# Patient Record
Sex: Male | Born: 1990 | Race: Black or African American | Hispanic: No | Smoking: Never smoker
Health system: Southern US, Community
[De-identification: ages and names within clinical notes are randomized; demographics above are authoritative.]

---
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
## 2013-09-24 ENCOUNTER — Emergency Department: Payer: Self-pay | Admitting: Internal Medicine

## 2013-09-24 LAB — URINALYSIS, COMPLETE
BLOOD: NEGATIVE
Bacteria: NONE SEEN
Bilirubin,UR: NEGATIVE
Glucose,UR: NEGATIVE mg/dL (ref 0–75)
Ketone: NEGATIVE
LEUKOCYTE ESTERASE: NEGATIVE
Nitrite: NEGATIVE
Ph: 5 (ref 4.5–8.0)
Protein: NEGATIVE
RBC,UR: NONE SEEN /HPF (ref 0–5)
Specific Gravity: 1.024 (ref 1.003–1.030)
Squamous Epithelial: <1

## 2013-09-24 LAB — COMPREHENSIVE METABOLIC PANEL
ALK PHOS: 98 U/L
ALT: 31 U/L (ref 12–78)
Albumin: 3.8 g/dL (ref 3.4–5.0)
Anion Gap: 9 (ref 7–16)
BUN: 11 mg/dL (ref 7–18)
Bilirubin,Total: 1 mg/dL (ref 0.2–1.0)
CO2: 24 mmol/L (ref 21–32)
Calcium, Total: 9.1 mg/dL (ref 8.5–10.1)
Chloride: 107 mmol/L (ref 98–107)
Creatinine: 1.04 mg/dL (ref 0.60–1.30)
EGFR (Non-African Amer.): >60
Glucose: 86 mg/dL (ref 65–99)
Osmolality: 278 (ref 275–301)
Potassium: 4 mmol/L (ref 3.5–5.1)
SGOT(AST): 34 U/L (ref 15–37)
Sodium: 140 mmol/L (ref 136–145)
TOTAL PROTEIN: 7.5 g/dL (ref 6.4–8.2)

## 2013-09-24 LAB — CBC
HCT: 43 % (ref 40.0–52.0)
HGB: 14.3 g/dL (ref 13.0–18.0)
MCH: 28.5 pg (ref 26.0–34.0)
MCHC: 33.2 g/dL (ref 32.0–36.0)
MCV: 86 fL (ref 80–100)
Platelet: 197 10*3/uL (ref 150–440)
RBC: 5.02 10*6/uL (ref 4.40–5.90)
RDW: 13.4 % (ref 11.5–14.5)
WBC: 4.4 10*3/uL (ref 3.8–10.6)

## 2013-09-24 LAB — LIPASE, BLOOD: LIPASE: 221 U/L (ref 73–393)

## 2014-08-03 ENCOUNTER — Emergency Department: Payer: Self-pay | Admitting: Emergency Medicine

## 2014-08-15 ENCOUNTER — Emergency Department: Payer: Self-pay | Admitting: Emergency Medicine

## 2015-05-14 ENCOUNTER — Emergency Department
Admission: EM | Admit: 2015-05-14 | Discharge: 2015-05-14 | Disposition: A | Discharge status: Home / Self Care | Payer: Self-pay | Attending: Emergency Medicine | Admitting: Emergency Medicine

## 2015-05-14 ENCOUNTER — Encounter: Payer: Self-pay | Admitting: Emergency Medicine

## 2015-05-14 ENCOUNTER — Emergency Department: Payer: Self-pay

## 2015-05-14 DIAGNOSIS — Y9389 Activity, other specified: Secondary | ICD-10-CM | POA: Insufficient documentation

## 2015-05-14 DIAGNOSIS — Y9289 Other specified places as the place of occurrence of the external cause: Secondary | ICD-10-CM | POA: Insufficient documentation

## 2015-05-14 DIAGNOSIS — S46911A Strain of unspecified muscle, fascia and tendon at shoulder and upper arm level, right arm, initial encounter: Secondary | ICD-10-CM | POA: Insufficient documentation

## 2015-05-14 DIAGNOSIS — Y998 Other external cause status: Secondary | ICD-10-CM | POA: Insufficient documentation

## 2015-05-14 DIAGNOSIS — X58XXXA Exposure to other specified factors, initial encounter: Secondary | ICD-10-CM | POA: Insufficient documentation

## 2015-05-14 MED ORDER — MELOXICAM 15 MG PO TABS: 15.0000 mg | ORAL_TABLET | Freq: Every day | ORAL | Status: DC

## 2015-05-14 NOTE — Discharge Instructions (Signed)

## 2015-05-14 NOTE — ED Notes (Signed)
Assessed per PA 

## 2015-05-14 NOTE — ED Notes (Signed)
Patient ambulatory to triage with steady gait, without difficulty or distress noted; pt reports right shoulder pain since last week with no known injury

## 2015-05-14 NOTE — ED Provider Notes (Signed)
[facility] Emergency Department Provider Note ____________________________________________  Time seen: Approximately 10:39 PM  I have reviewed the triage vital signs and the nursing notes.   HISTORY  Chief Complaint Shoulder Pain  HPI Brent Page is a 24 y.o. male who presents to the emergency department for evaluation of right shoulder pain. States that he has had pain for the last week. He denies injury. Pain is reproduced with certain movements. He has not been taking anything for pain.   History reviewed. No pertinent past medical history.  There are no active problems to display for this patient.   History reviewed. No pertinent past surgical history.  Current Outpatient Rx  Name  Route  Sig  Dispense  Refill  . meloxicam (MOBIC) 15 MG tablet   Oral   Take 1 tablet (15 mg total) by mouth daily.   30 tablet   0     Allergies Review of patient's allergies indicates no known allergies.  No family history on file.  Social History Social History  Substance Use Topics  . Smoking status: Never Smoker   . Smokeless tobacco: None  . Alcohol Use: No    Review of Systems Constitutional: No recent illness. Eyes: No visual changes. ENT: No sore throat. Cardiovascular: Denies chest pain or palpitations. Respiratory: Denies shortness of breath. Gastrointestinal: No abdominal pain.  Genitourinary: Negative for dysuria. Musculoskeletal: Pain in right shoulder Skin: Negative for rash. Neurological: Negative for headaches, focal weakness or numbness. 10-point ROS otherwise negative.  ____________________________________________   PHYSICAL EXAM:  VITAL SIGNS: ED Triage Vitals  Enc Vitals Group     BP 05/14/15 2105 132/51 mmHg     Pulse Rate 05/14/15 2105 84     Resp 05/14/15 2105 18     Temp 05/14/15 2105 97.6 F (36.4 C)     Temp src --      SpO2 05/14/15 2105 100 %     Weight 05/14/15 2105 160 lb (72.576 kg)     Height 05/14/15  2105 5\' 7"  (1.702 m)     Head Cir --      Peak Flow --      Pain Score 05/14/15 2104 9     Pain Loc --      Pain Edu? --      Excl. in GC? --     Constitutional: Alert and oriented. Well appearing and in no acute distress. Eyes: Conjunctivae are normal. EOMI. Head: Atraumatic. Nose: No congestion/rhinnorhea. Neck: No stridor.  Respiratory: Normal respiratory effort.   Musculoskeletal: Anterior right shoulder tenderness elicited with movement against resistance. Neurologic:  Normal speech and language. No gross focal neurologic deficits are appreciated. Speech is normal. No gait instability. Skin:  Skin is warm, dry and intact. Atraumatic. Psychiatric: Mood and affect are normal. Speech and behavior are normal.  ____________________________________________   LABS (all labs ordered are listed, but only abnormal results are displayed)  Labs Reviewed - No data to display ____________________________________________  RADIOLOGY  Right shoulder negative for acute bony abnormality. ____________________________________________   PROCEDURES  Procedure(s) performed: None   ____________________________________________   INITIAL IMPRESSION / ASSESSMENT AND PLAN / ED COURSE  Pertinent labs & imaging results that were available during my care of the patient were reviewed by me and considered in my medical decision making (see chart for details).  Patient was advised to follow-up with the orthopedic doctor for symptoms that are not improving over the next week. He was advised to return to the emergency department  for symptoms that change or worsen if he is unable schedule an appointment. ____________________________________________   FINAL CLINICAL IMPRESSION(S) / ED DIAGNOSES  Final diagnoses:  Shoulder strain, right, initial encounter       Chinita Pester, FNP 05/14/15 2241  Darien Ramus, MD 05/14/15 2320

## 2018-07-20 ENCOUNTER — Ambulatory Visit: Payer: Self-pay | Admitting: Urology

## 2018-07-26 ENCOUNTER — Ambulatory Visit: Payer: Self-pay | Admitting: Urology

## 2018-07-26 ENCOUNTER — Encounter: Payer: Self-pay | Admitting: Urology

## 2021-03-22 ENCOUNTER — Other Ambulatory Visit: Payer: Self-pay

## 2021-03-22 ENCOUNTER — Encounter: Payer: Self-pay | Admitting: Emergency Medicine

## 2021-03-22 ENCOUNTER — Emergency Department: Payer: Commercial Managed Care - PPO

## 2021-03-22 DIAGNOSIS — N390 Urinary tract infection, site not specified: Secondary | ICD-10-CM | POA: Diagnosis not present

## 2021-03-22 DIAGNOSIS — R1011 Right upper quadrant pain: Secondary | ICD-10-CM

## 2021-03-22 DIAGNOSIS — K529 Noninfective gastroenteritis and colitis, unspecified: Secondary | ICD-10-CM | POA: Diagnosis not present

## 2021-03-22 LAB — COMPREHENSIVE METABOLIC PANEL
ALT: 31 U/L (ref 0–44)
AST: 27 U/L (ref 15–41)
Albumin: 4.4 g/dL (ref 3.5–5.0)
Alkaline Phosphatase: 52 U/L (ref 38–126)
Anion gap: 11 (ref 5–15)
BUN: 12 mg/dL (ref 6–20)
CO2: 24 mmol/L (ref 22–32)
Calcium: 9.4 mg/dL (ref 8.9–10.3)
Chloride: 101 mmol/L (ref 98–111)
Creatinine, Ser: 1.23 mg/dL (ref 0.61–1.24)
GFR, Estimated: >60 mL/min (ref >60)
Glucose, Bld: 91 mg/dL (ref 70–99)
Potassium: 3.7 mmol/L (ref 3.5–5.1)
Sodium: 136 mmol/L (ref 135–145)
Total Bilirubin: 1.2 mg/dL (ref 0.3–1.2)
Total Protein: 8.1 g/dL (ref 6.5–8.1)

## 2021-03-22 LAB — CBC
HCT: 47.3 % (ref 39.0–52.0)
Hemoglobin: 16.4 g/dL (ref 13.0–17.0)
MCH: 29.1 pg (ref 26.0–34.0)
MCHC: 34.7 g/dL (ref 30.0–36.0)
MCV: 83.9 fL (ref 80.0–100.0)
Platelets: 263 10*3/uL (ref 150–400)
RBC: 5.64 MIL/uL (ref 4.22–5.81)
RDW: 11.9 % (ref 11.5–15.5)
WBC: 4.5 10*3/uL (ref 4.0–10.5)
nRBC: 0 % (ref 0.0–0.2)

## 2021-03-22 LAB — LIPASE, BLOOD: Lipase: 51 U/L (ref 11–51)

## 2021-03-22 MED ORDER — ACETAMINOPHEN 325 MG PO TABS
650.0000 mg | ORAL_TABLET | Freq: Once | ORAL | Status: AC
  Administered 2021-03-22: 650 mg via ORAL

## 2021-03-22 MED ORDER — ACETAMINOPHEN 325 MG PO TABS
ORAL_TABLET | ORAL | Status: AC
  Filled 2021-03-22: qty 2

## 2021-03-22 NOTE — ED Triage Notes (Signed)
C/O RUQ abdominal pain x 3 days.  Referred to ED by [facility]  AAOx3.  Skin warm and dry.NAD

## 2021-03-22 NOTE — ED Notes (Signed)
Pt reports he has pain administered some Tylenol

## 2021-03-23 ENCOUNTER — Emergency Department: Payer: Commercial Managed Care - PPO

## 2021-03-23 ENCOUNTER — Emergency Department
Admission: EM | Admit: 2021-03-23 | Discharge: 2021-03-23 | Disposition: A | Discharge status: Home / Self Care | Payer: Commercial Managed Care - PPO | Attending: Emergency Medicine | Admitting: Emergency Medicine

## 2021-03-23 ENCOUNTER — Other Ambulatory Visit: Payer: Self-pay

## 2021-03-23 DIAGNOSIS — R0602 Shortness of breath: Secondary | ICD-10-CM | POA: Diagnosis not present

## 2021-03-23 DIAGNOSIS — K5900 Constipation, unspecified: Secondary | ICD-10-CM

## 2021-03-23 DIAGNOSIS — R109 Unspecified abdominal pain: Secondary | ICD-10-CM

## 2021-03-23 DIAGNOSIS — R1011 Right upper quadrant pain: Secondary | ICD-10-CM

## 2021-03-23 DIAGNOSIS — N390 Urinary tract infection, site not specified: Secondary | ICD-10-CM

## 2021-03-23 LAB — URINALYSIS, ROUTINE W REFLEX MICROSCOPIC
Bacteria, UA: NONE SEEN
Bilirubin Urine: NEGATIVE
Glucose, UA: NEGATIVE mg/dL
Hgb urine dipstick: NEGATIVE
Ketones, ur: 20 mg/dL — AB
Nitrite: NEGATIVE
Protein, ur: NEGATIVE mg/dL
Specific Gravity, Urine: 1.029 (ref 1.005–1.030)
pH: 5 (ref 5.0–8.0)

## 2021-03-23 LAB — TROPONIN I (HIGH SENSITIVITY): Troponin I (High Sensitivity): 4 ng/L (ref <18)

## 2021-03-23 LAB — D-DIMER, QUANTITATIVE: D-Dimer, Quant: 0.27 ug/mL-FEU (ref 0.00–0.50)

## 2021-03-23 MED ORDER — FOSFOMYCIN TROMETHAMINE 3 G PO PACK
3.0000 g | PACK | Freq: Once | ORAL | Status: AC
  Administered 2021-03-23: 3 g via ORAL
  Filled 2021-03-23: qty 3

## 2021-03-23 MED ORDER — SODIUM CHLORIDE 0.9 % IV BOLUS
1000.0000 mL | Freq: Once | INTRAVENOUS | Status: AC
  Administered 2021-03-23: 1000 mL via INTRAVENOUS

## 2021-03-23 MED ORDER — LACTULOSE 10 GM/15ML PO SOLN: 20.0000 g | Freq: Every day | ORAL | 0 refills | Status: DC | PRN

## 2021-03-23 MED ORDER — KETOROLAC TROMETHAMINE 30 MG/ML IJ SOLN
15.0000 mg | Freq: Once | INTRAMUSCULAR | Status: AC
  Administered 2021-03-23: 15 mg via INTRAVENOUS
  Filled 2021-03-23: qty 1

## 2021-03-23 MED ORDER — MORPHINE SULFATE (PF) 4 MG/ML IV SOLN
4.0000 mg | Freq: Once | INTRAVENOUS | Status: AC
  Administered 2021-03-23: 4 mg via INTRAVENOUS
  Filled 2021-03-23: qty 1

## 2021-03-23 MED ORDER — DICYCLOMINE HCL 20 MG PO TABS
20.0000 mg | ORAL_TABLET | Freq: Four times a day (QID) | ORAL | 0 refills | Status: DC
Start: 2021-03-23 — End: 2024-01-21

## 2021-03-23 NOTE — ED Provider Notes (Addendum)
[facility] Emergency Department Provider Note   ____________________________________________   Event Date/Time   First MD Initiated Contact with Patient 03/23/21 [phone]     (approximate)  I have reviewed the triage vital signs and the nursing notes.   HISTORY  Chief Complaint Abdominal pain    HPI Brent Page is a 30 y.o. male referred to the ED from [facility] for further evaluation of abdominal/flank pain.  Patient reports a 3 to 4-day history of waxing/waning pain in his right flank/right upper quadrant area.  Denies associated fever, cough, chest pain, nausea, vomiting, hematuria.  States when the pain is severe it makes him feel short of breath.  Denies anorexia.  Denies recent trauma, travel or hormone use.     Past medical history None  There are no problems to display for this patient.   History reviewed. No pertinent surgical history.  Prior to Admission medications   Medication Sig Start Date End Date Taking? Authorizing Provider  dicyclomine (BENTYL) 20 MG tablet Take 1 tablet (20 mg total) by mouth every 6 (six) hours. 03/23/21  Yes Irean Hong, MD  lactulose (CHRONULAC) 10 GM/15ML solution Take 30 mLs (20 g total) by mouth daily as needed for mild constipation. 03/23/21  Yes Irean Hong, MD    Allergies Patient has no known allergies.  No family history on file.  Social History Social History   Tobacco Use   Smoking status: Never  Substance Use Topics   Alcohol use: No    Review of Systems  Constitutional: No fever/chills Eyes: No visual changes. ENT: No sore throat. Cardiovascular: Denies chest pain. Respiratory: Denies shortness of breath. Gastrointestinal: Positive for right flank and abdominal pain.  No nausea, no vomiting.  No diarrhea.  No constipation. Genitourinary: Negative for dysuria. Musculoskeletal: Negative for back pain. Skin: Negative for rash. Neurological: Negative for headaches, focal weakness or  numbness.   ____________________________________________   PHYSICAL EXAM:  VITAL SIGNS: ED Triage Vitals  Enc Vitals Group     BP 03/23/21 0304 (!) 143/86     Pulse Rate 03/23/21 0304 86     Resp 03/23/21 0304 16     Temp --      Temp src --      SpO2 03/23/21 0304 100 %     Weight 03/22/21 1615 160 lb 0.9 oz (72.6 kg)     Height 03/22/21 1615 5\' 7"  (1.702 m)     Head Circumference --      Peak Flow --      Pain Score 03/22/21 1615 10     Pain Loc --      Pain Edu? --      Excl. in GC? --     Constitutional: Alert and oriented. Well appearing and in no acute distress. Eyes: Conjunctivae are normal. PERRL. EOMI. Head: Atraumatic. Nose: No congestion/rhinnorhea. Mouth/Throat: Mucous membranes are moist.   Neck: No stridor.   Cardiovascular: Normal rate, regular rhythm. Grossly normal heart sounds.  Good peripheral circulation. Respiratory: Normal respiratory effort.  No retractions. Lungs CTAB. Gastrointestinal: Soft and nontender to light or deep palpation. No distention. No abdominal bruits. No CVA tenderness. Musculoskeletal: No lower extremity tenderness nor edema.  No joint effusions. Neurologic:  Normal speech and language. No gross focal neurologic deficits are appreciated. No gait instability. Skin:  Skin is warm, dry and intact. No rash noted.  No vesicles. Psychiatric: Mood and affect are normal. Speech and behavior are normal.  ____________________________________________  LABS (all labs ordered are listed, but only abnormal results are displayed)  Labs Reviewed  URINALYSIS, ROUTINE W REFLEX MICROSCOPIC - Abnormal; Notable for the following components:      Result Value   Color, Urine YELLOW (*)    APPearance HAZY (*)    Ketones, ur 20 (*)    Leukocytes,Ua TRACE (*)    All other components within normal limits  LIPASE, BLOOD  COMPREHENSIVE METABOLIC PANEL  CBC  D-DIMER, QUANTITATIVE  TROPONIN I (HIGH SENSITIVITY)    ____________________________________________  EKG  ED ECG REPORT I, Jhonnie Aliano J, the attending physician, personally viewed and interpreted this ECG.   Date: 03/23/2021  EKG Time: 0546  Rate: 86  Rhythm: normal sinus rhythm  Axis: Normal  Intervals:none  ST&T Change: Nonspecific  ____________________________________________  RADIOLOGY I, Jonika Critz J, personally viewed and evaluated these images (plain radiographs) as part of my medical decision making, as well as reviewing the written report by the radiologist.  ED MD interpretation: Ultrasound demonstrates mild hepatic steatosis; chest x-ray demonstrates no acute cardiopulmonary process; CT renal stone study unremarkable, no appendicitis  Official radiology report(s): DG Chest 2 View  Result Date: 03/23/2021 CLINICAL DATA:  Right upper quadrant pain for 3 days EXAM: CHEST - 2 VIEW COMPARISON:  03/18/2010 FINDINGS: Normal heart size and mediastinal contours. No acute infiltrate or edema. No effusion or pneumothorax. No acute osseous findings. Bowel gas in the upper abdomen is likely within colon and similar to prior. IMPRESSION: No active cardiopulmonary disease. Electronically Signed   By: Tiburcio Pea M.D.   On: 03/23/2021 05:01   CT Renal Stone Study  Result Date: 03/23/2021 CLINICAL DATA:  Right upper quadrant pain for 3 days EXAM: CT ABDOMEN AND PELVIS WITHOUT CONTRAST TECHNIQUE: Multidetector CT imaging of the abdomen and pelvis was performed following the standard protocol without IV contrast. COMPARISON:  None. FINDINGS: Lower chest:  No contributory findings. Hepatobiliary: No focal liver abnormality.No evidence of biliary obstruction or stone. Pancreas: Unremarkable. Spleen: Unremarkable. Adrenals/Urinary Tract: Negative adrenals. No hydronephrosis or stone. Unremarkable bladder. Stomach/Bowel:  No obstruction. No appendicitis. Vascular/Lymphatic: No acute vascular abnormality. No mass or adenopathy. Reproductive:No  pathologic findings. Other: No ascites or pneumoperitoneum. Musculoskeletal: No acute abnormalities. IMPRESSION: Negative abdominal CT. Electronically Signed   By: Tiburcio Pea M.D.   On: 03/23/2021 05:25   US ABDOMEN LIMITED RUQ (LIVER/GB)  Result Date: 03/23/2021 CLINICAL DATA:  Right upper quadrant abdominal pain EXAM: ULTRASOUND ABDOMEN LIMITED RIGHT UPPER QUADRANT COMPARISON:  09/24/2013 FINDINGS: Gallbladder: No gallstones or wall thickening visualized. No sonographic Murphy sign noted by sonographer. Common bile duct: Diameter: 3 mm in proximal diameter Liver: Hepatic parenchymal echogenicity has mildly increased diffusely since prior examination and the hepatic echotexture has mildly coarsened in keeping with changes of mild hepatic steatosis, new since prior examination. No focal intrahepatic masses are seen. There is no intrahepatic biliary ductal dilation. Portal vein is patent on color Doppler imaging with normal direction of blood flow towards the liver. Other: None. IMPRESSION: Interval development of mild hepatic steatosis. Electronically Signed   By: Helyn Numbers M.D.   On: 03/23/2021 00:29    ____________________________________________   PROCEDURES  Procedure(s) performed (including Critical Care):  Procedures   ____________________________________________   INITIAL IMPRESSION / ASSESSMENT AND PLAN / ED COURSE  As part of my medical decision making, I reviewed the following data within the electronic MEDICAL RECORD NUMBER Nursing notes reviewed and incorporated, Labs reviewed, Old chart reviewed, Radiograph reviewed, and Notes from prior ED visits  30 year old male presenting with right flank/abdominal pain. Differential diagnosis includes, but is not limited to, acute appendicitis, renal colic, testicular torsion, urinary tract infection/pyelonephritis, prostatitis,  epididymitis, diverticulitis, small bowel obstruction or ileus, colitis, abdominal aortic aneurysm,  gastroenteritis, hernia, etc.   Laboratory results unremarkable.  Awaiting urine specimen.  Ultrasound demonstrates mild hepatic steatosis.  Will obtain chest x-ray, CT renal colic study.  Initiate IV fluid resuscitation, IV Toradol for pain.  Will reassess.  Clinical Course as of 03/23/21 0701  Sat Mar 23, 2021  0536 CT negative.  Unclear etiology of patient's pain.  Given his complaint of shortness of breath, will check EKG, troponin and D-dimer. [JS]  [id number] Updated patient of all test results thus far. He is feeling better after Morphine and watching a show on his cell phone.  [JS]  [id number] Updated patient on all test results.  Dose fosfomycin prior to discharge.  Strict return precautions given.  Patient verbalizes understanding and agrees with plan of care. [JS]    Clinical Course User Index [JS] Irean Hong, MD     ____________________________________________   FINAL CLINICAL IMPRESSION(S) / ED DIAGNOSES  Final diagnoses:  RUQ abdominal pain  Right flank pain  Constipation, unspecified constipation type  Urinary tract infection without hematuria, site unspecified     ED Discharge Orders          Ordered    lactulose (CHRONULAC) 10 GM/15ML solution  Daily PRN        03/23/21 0700    dicyclomine (BENTYL) 20 MG tablet  Every 6 hours        03/23/21 0700             Note:  This document was prepared using Dragon voice recognition software and may include unintentional dictation errors.    Irean Hong, MD 03/23/21 2637    Irean Hong, MD 03/23/21 [phone]

## 2021-03-23 NOTE — Discharge Instructions (Signed)
1.  Take Lactulose as needed for bowel movements. 2.  You may take Bentyl as needed for discomfort. 3.  Return to the ER for worsening symptoms, persistent vomiting, difficulty breathing or other concerns.

## 2021-03-23 NOTE — ED Notes (Addendum)
Pt to CT at this time.

## 2021-03-23 NOTE — ED Notes (Signed)
Pt returned from CT at this time.  

## 2021-05-07 ENCOUNTER — Ambulatory Visit: Payer: Self-pay

## 2022-06-17 ENCOUNTER — Encounter: Payer: Self-pay | Admitting: Urology

## 2023-01-02 IMAGING — CT CT RENAL STONE PROTOCOL
3 of 4 series · 9 of 46 positions shown, 16 images · non-contrast
Comparison: None.

CLINICAL DATA: Right upper quadrant pain for 3 days

EXAM:
CT ABDOMEN AND PELVIS WITHOUT CONTRAST
TECHNIQUE: Multidetector CT imaging of the abdomen and pelvis was performed
following the standard protocol without IV contrast.

[Series 4: lung bases · axial · 0.81mm/px · z∈[-770,-680]mm · 5 of 28 slices shown, 10 images]
[im 5/28  soft-tissue]
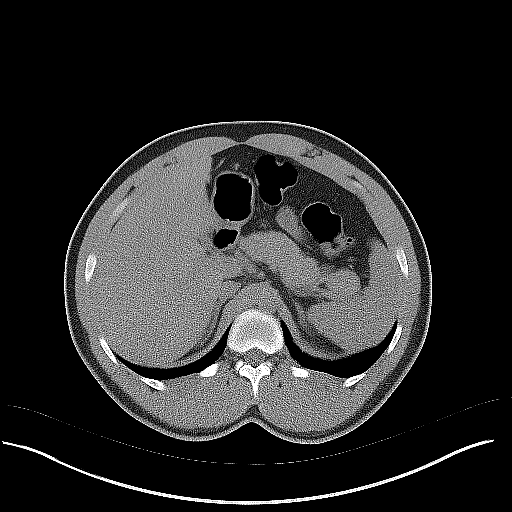
[im 5/28  bone]
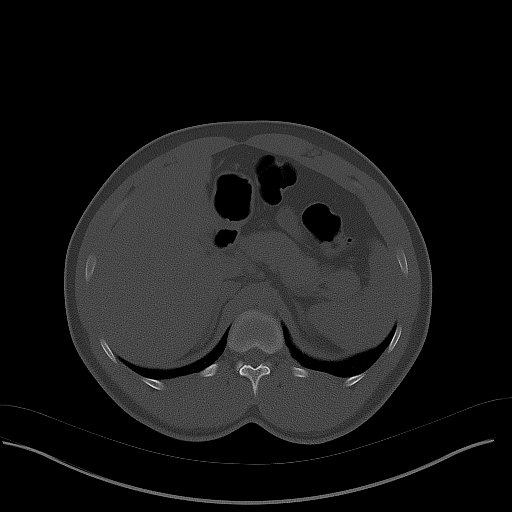
[im 10/28  soft-tissue]
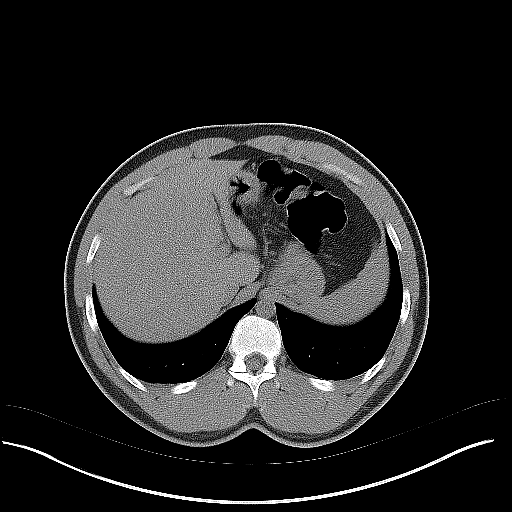
[im 10/28  lung]
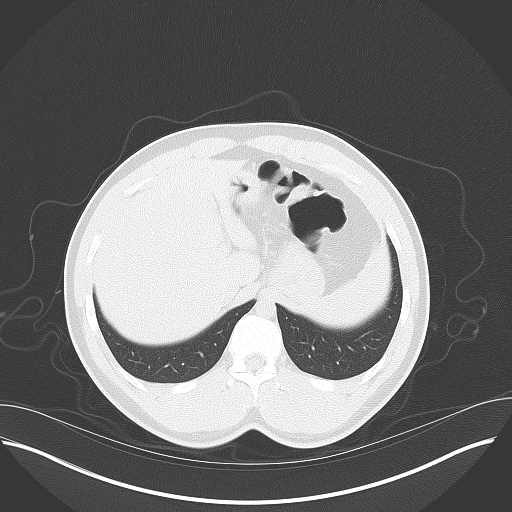
[im 14/28  soft-tissue]
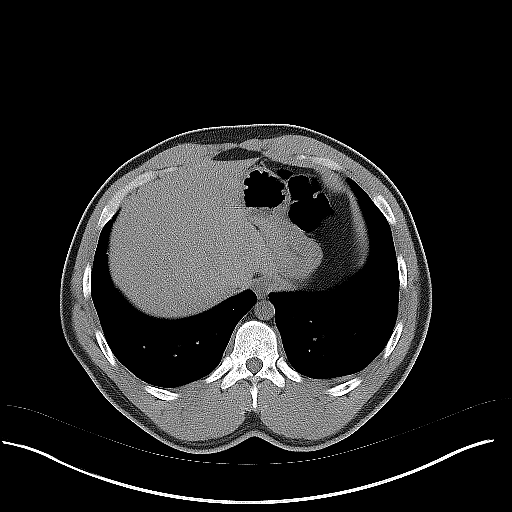
[im 14/28  lung]
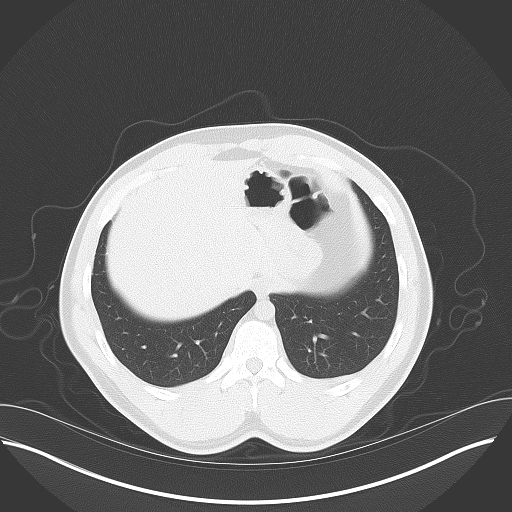
[im 19/28  soft-tissue]
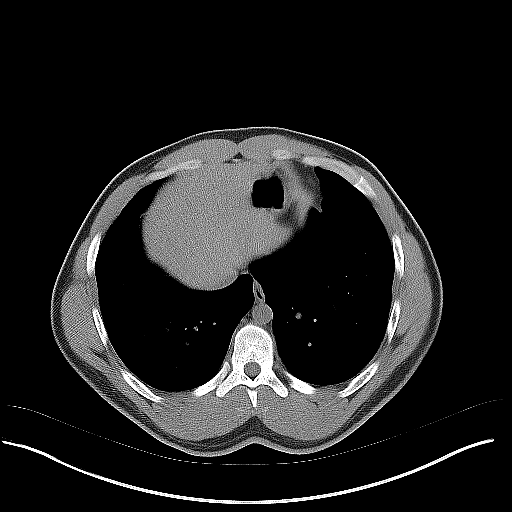
[im 19/28  lung]
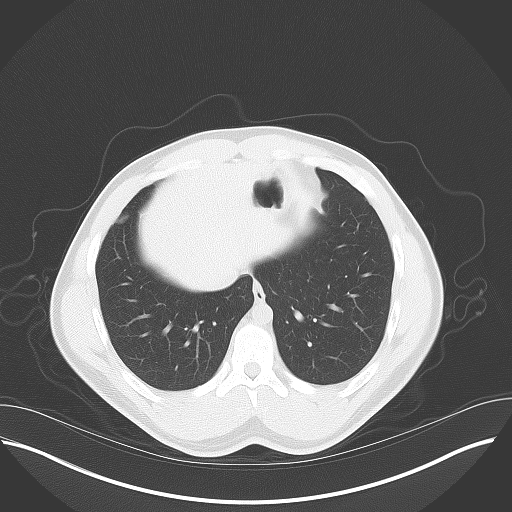
[im 23/28  soft-tissue]
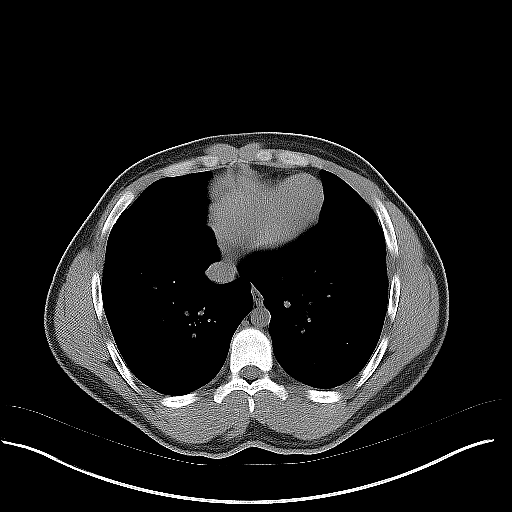
[im 23/28  lung]
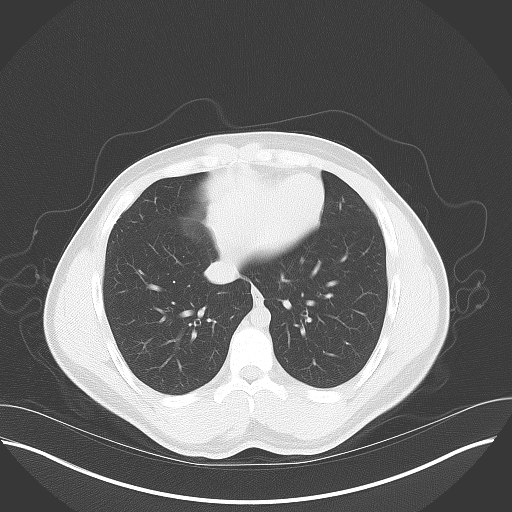

[Series 5: coronal · coronal · 0.77mm/px · 3 of 145 slices shown, 4 images]
[im 49/145  soft-tissue]
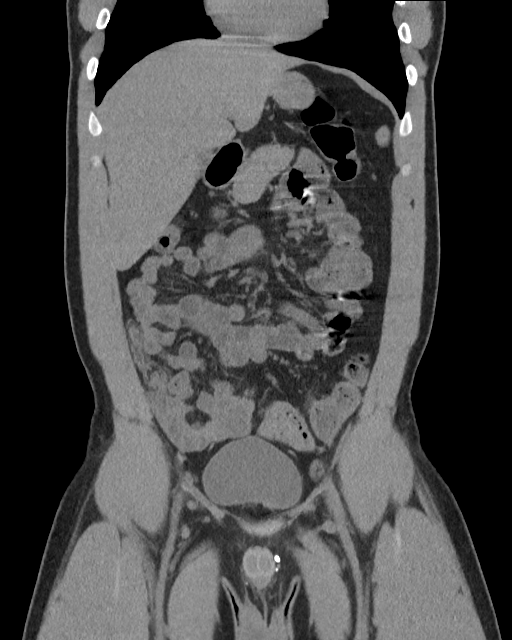
[im 65/145  soft-tissue]
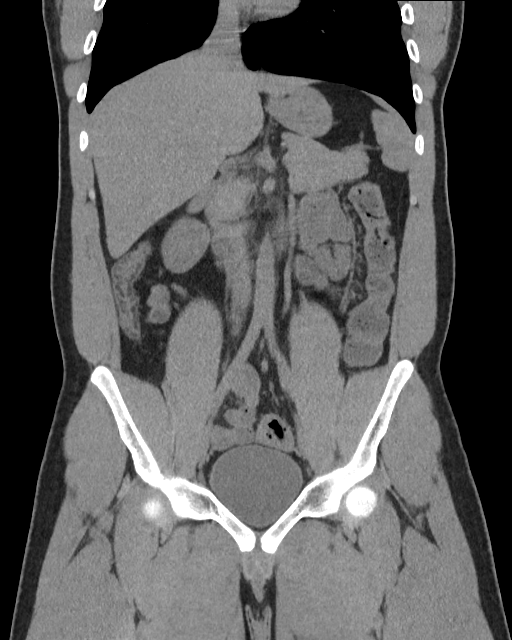
[im 65/145  bone]
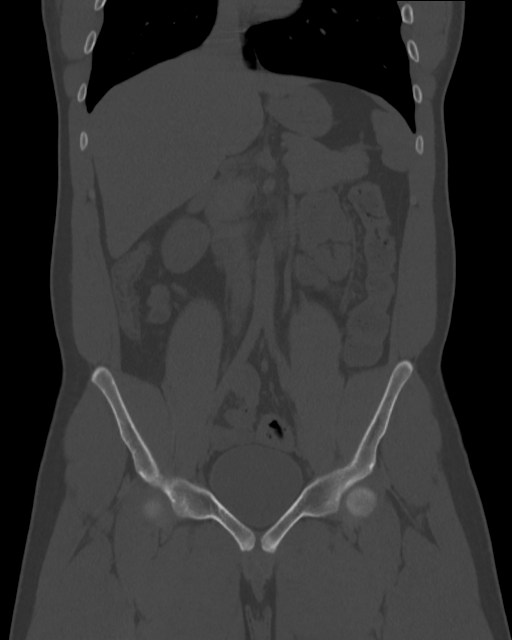
[im 81/145  soft-tissue]
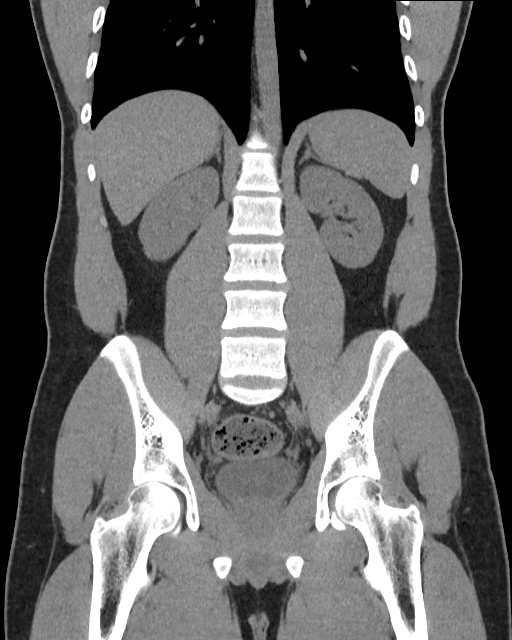

[Series 6: sagittal · sagittal · 0.56mm/px · 1 of 169 slices shown, 2 images]
[im 57/169  soft-tissue]
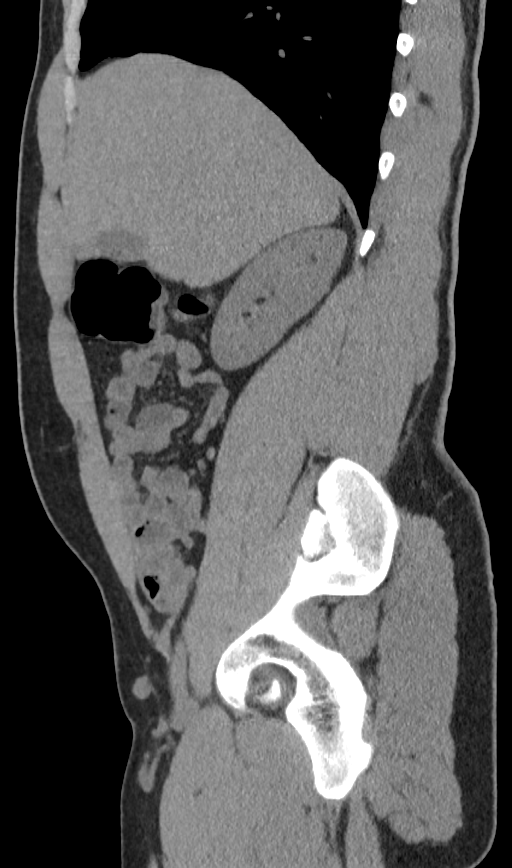
[im 57/169  bone]
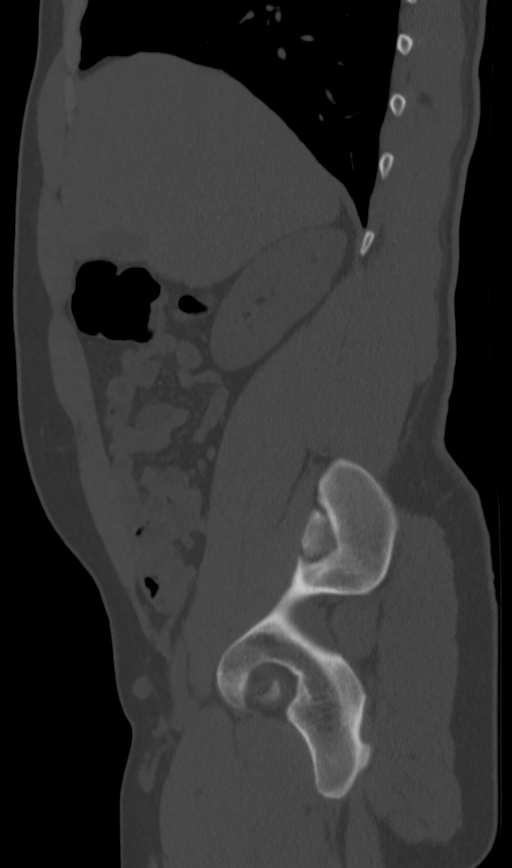

[9 of 46 positions shown; findings below may reference images not displayed]

FINDINGS: Lower chest:  No contributory findings.

Hepatobiliary: No focal liver abnormality.No evidence of biliary
obstruction or stone.

Pancreas: Unremarkable.

Spleen: Unremarkable.

Adrenals/Urinary Tract: Negative adrenals. No hydronephrosis or
stone. Unremarkable bladder.

Stomach/Bowel:  No obstruction. No appendicitis.

Vascular/Lymphatic: No acute vascular abnormality. No mass or
adenopathy.

Reproductive:No pathologic findings.

Other: No ascites or pneumoperitoneum.

Musculoskeletal: No acute abnormalities.
IMPRESSION: Negative abdominal CT.

## 2023-04-03 ENCOUNTER — Encounter: Payer: Self-pay | Admitting: Urology

## 2023-04-06 ENCOUNTER — Encounter: Payer: Commercial Managed Care - PPO | Admitting: Urology

## 2023-04-06 NOTE — Progress Notes (Deleted)
   Assessment: 1. Redundant foreskin      Plan: ***  Chief Complaint: No chief complaint on file.   History of Present Illness:  Brent Page is a 32 y.o. male who is seen for evaluation of redundant foreskin.   Past Medical History:  No past medical history on file.  Past Surgical History:  No past surgical history on file.  Allergies:  No Known Allergies  Family History:  No family history on file.  Social History:  Social History   Tobacco Use   Smoking status: Never  Substance Use Topics   Alcohol use: No    Review of symptoms:  Constitutional:  Negative for unexplained weight loss, night sweats, fever, chills ENT:  Negative for nose bleeds, sinus pain, painful swallowing CV:  Negative for chest pain, shortness of breath, exercise intolerance, palpitations, loss of consciousness Resp:  Negative for cough, wheezing, shortness of breath GI:  Negative for nausea, vomiting, diarrhea, bloody stools GU:  Positives noted in HPI; otherwise negative for gross hematuria, dysuria, urinary incontinence Neuro:  Negative for seizures, poor balance, limb weakness, slurred speech Psych:  Negative for lack of energy, depression, anxiety Endocrine:  Negative for polydipsia, polyuria, symptoms of hypoglycemia (dizziness, hunger, sweating) Hematologic:  Negative for anemia, purpura, petechia, prolonged or excessive bleeding, use of anticoagulants  Allergic:  Negative for difficulty breathing or choking as a result of exposure to anything; no shellfish allergy; no allergic response (rash/itch) to materials, foods  Physical exam: There were no vitals taken for this visit. GENERAL APPEARANCE:  Well appearing, well developed, well nourished, NAD HEENT: Atraumatic, Normocephalic, oropharynx clear. NECK: Supple without lymphadenopathy or thyromegaly. LUNGS: Clear to auscultation bilaterally. HEART: Regular Rate and Rhythm without murmurs, gallops, or rubs. ABDOMEN: Soft,  non-tender, No Masses. EXTREMITIES: Moves all extremities well.  Without clubbing, cyanosis, or edema. NEUROLOGIC:  Alert and oriented x 3, normal gait, CN II-XII grossly intact.  MENTAL STATUS:  Appropriate. BACK:  Non-tender to palpation.  No CVAT SKIN:  Warm, dry and intact.   GU: Penis:  {Exam; penis:5791} Meatus: {Meatus:15530} Scrotum: {pe scrotum:310183} Testis: {Exam; testicles:5790}   Results: None

## 2023-09-17 ENCOUNTER — Encounter: Payer: Self-pay | Admitting: Urology

## 2024-01-21 ENCOUNTER — Ambulatory Visit: Payer: Self-pay | Admitting: Family Medicine

## 2024-01-21 ENCOUNTER — Encounter: Payer: Self-pay | Admitting: Family Medicine

## 2024-01-21 VITALS — BP 138/88 | HR 88 | Ht 67.0 in | Wt 203.1 lb

## 2024-01-21 DIAGNOSIS — R030 Elevated blood-pressure reading, without diagnosis of hypertension: Secondary | ICD-10-CM | POA: Diagnosis not present

## 2024-01-21 DIAGNOSIS — E669 Obesity, unspecified: Secondary | ICD-10-CM | POA: Diagnosis not present

## 2024-01-21 DIAGNOSIS — K760 Fatty (change of) liver, not elsewhere classified: Secondary | ICD-10-CM

## 2024-01-21 DIAGNOSIS — Z131 Encounter for screening for diabetes mellitus: Secondary | ICD-10-CM | POA: Diagnosis not present

## 2024-01-21 DIAGNOSIS — Z136 Encounter for screening for cardiovascular disorders: Secondary | ICD-10-CM

## 2024-01-21 DIAGNOSIS — Z13220 Encounter for screening for lipoid disorders: Secondary | ICD-10-CM

## 2024-01-21 DIAGNOSIS — Z130 Encounter for screening for diseases of the blood and blood-forming organs and certain disorders involving the immune mechanism: Secondary | ICD-10-CM

## 2024-01-21 NOTE — Patient Instructions (Signed)
 YOUR PLAN: -ELEVATED BLOOD PRESSURE: Your blood pressure readings have been high, which might be due to anxiety during medical exams. We will do blood work to check for diabetes, cholesterol, and kidney and liver function. You should buy an arm blood pressure monitor and check your blood pressure at home. If your readings are consistently above 130/80 mmHg, please come back. Follow a Mediterranean diet, reduce salt, and avoid processed foods. Aim for 150 minutes of exercise each week. We will see you again in 4-8 weeks after your blood work.   -RIGHT HIP AND LOW BACK MUSCULOSKELETAL PAIN: Your intermittent mild pain is likely due to strain from your job as a [employer]. Take frequent breaks and use proper techniques when getting in and out of your truck. You can take Tylenol  for pain as needed. If the pain gets worse or doesn't go away, we might need to do a hip x-ray. Seek immediate care if you experience severe pain or other symptoms.   -OBESITY: Your weight is higher than recommended, which can affect your overall health. Follow a Mediterranean diet, focusing on vegetables and white meat, and reduce processed and greasy foods. Aim for 150 minutes of exercise each week. You might find the MyFitnessPal app helpful for tracking your diet and progress.   INSTRUCTIONS: Please follow up in 4-8 weeks after completing your blood work. Monitor your blood pressure at home and return if it is consistently above 130/80 mmHg. If your right hip or low back pain worsens or persists, consider getting a hip x-ray and seek immediate care if you experience severe pain or additional symptoms.

## 2024-01-21 NOTE — Progress Notes (Signed)
 New Patient Office Visit  Subjective    Patient ID: Brent Page, male    DOB: 1990/08/07  Age: 32 y.o. MRN: [id number]  CC:  Chief Complaint  Patient presents with   Establish Care    Patient is here today to establish care, requesting blood work and overall health check    HPI Brent Page presents to establish care  Discussed the use of AI scribe software for clinical note transcription with the patient, who gave verbal consent to proceed.  History of Present Illness Brent Page is a 33 year old male who presents for establish care.  He has elevated BP in office today. 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 He has not monitored his blood pressure outside of clinical settings recently and does not take any antihypertensive medication.  He experiences intermittent right hip rated 3-4 out of 10, occurring after bathroom use and certain movements. 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  He has a family history of diabetes. He is otherwise healthy, with no fever, chills, or changes in bowel movements.   Outpatient Encounter Medications as of 01/21/2024  Medication Sig   dicyclomine  (BENTYL ) 20 MG tablet Take 1 tablet (20 mg total) by mouth every 6 (six) hours. (Patient not taking: Reported on 01/21/2024)   lactulose  (CHRONULAC ) 10 GM/15ML solution Take 30 mLs (20 g total) by mouth daily as needed for mild constipation. (Patient not taking: Reported on 01/21/2024)   No facility-administered encounter medications on file as of 01/21/2024.    History reviewed. No pertinent past medical history.  History reviewed. No pertinent surgical history.  History reviewed. No pertinent family history.  Social History   Socioeconomic History   Marital status: Divorced    Spouse name: Not on file   Number of children: Not on file   Years of education: Not  on file   Highest education level: GED or equivalent  Occupational History   Not on file  Tobacco Use   Smoking status: Never   Smokeless tobacco: Never  Substance and Sexual Activity   Alcohol use: No   Drug use: Never   Sexual activity: Not on file  Other Topics Concern   Not on file  Social History Narrative   Not on file   Social Drivers of Health   Financial Resource Strain: Medium Risk (01/20/2024)   Overall Financial Resource Strain (CARDIA)    Difficulty of Paying Living Expenses: Somewhat hard  Food Insecurity: Food Insecurity Present (01/20/2024)   Hunger Vital Sign    Worried About Running Out of Food in the Last Year: Sometimes true    Ran Out of Food in the Last Year: Sometimes true  Transportation Needs: No Transportation Needs (01/20/2024)   PRAPARE - Administrator, Civil Service (Medical): No    Lack of Transportation (Non-Medical): No  Physical Activity: Insufficiently Active (01/20/2024)   Exercise Vital Sign    Days of Exercise per Week: 1 day    Minutes of Exercise per Session: 10 min  Stress: No Stress Concern Present (01/20/2024)   [employer] of Occupational Health - Occupational Stress Questionnaire    Feeling of Stress: Only a little  Social Connections: Unknown (01/20/2024)   Social Connection and Isolation Panel    Frequency of Communication with Friends and Family: More than three times a week    Frequency of Social Gatherings  with Friends and Family: Twice a week    Attends Religious Services: Patient declined    Active Member of Clubs or Organizations: No    Attends Engineer, structural: Not on file    Marital Status: Patient declined  Intimate Partner Violence: Not At Risk (01/21/2024)   Humiliation, Afraid, Rape, and Kick questionnaire    Fear of Current or Ex-Partner: No    Emotionally Abused: No    Physically Abused: No    Sexually Abused: No    Review of Systems  All other systems reviewed and are  negative.       Objective    BP 138/88   Pulse 88   Ht 5' 7 (1.702 m)   Wt 203 lb 2 oz (92.1 kg)   SpO2 95%   BMI 31.81 kg/m   Physical Exam Vitals and nursing note reviewed.  Constitutional:      Appearance: Normal appearance.  HENT:     Head: Normocephalic.     Right Ear: External ear normal.     Left Ear: External ear normal.  Eyes:     Conjunctiva/sclera: Conjunctivae normal.  Cardiovascular:     Rate and Rhythm: Normal rate.  Pulmonary:     Effort: Pulmonary effort is normal. No respiratory distress.  Abdominal:     Palpations: Abdomen is soft.  Musculoskeletal:        General: Normal range of motion.  Skin:    General: Skin is warm.  Neurological:     Mental Status: He is alert and oriented to person, place, and time.  Psychiatric:        Mood and Affect: Mood normal.        Assessment & Plan:   Problem List Items Addressed This Visit   None Visit Diagnoses       Hepatic steatosis    -  Primary   Relevant Orders   Comprehensive Metabolic Panel (CMET)     Elevated blood pressure reading without diagnosis of hypertension         Obesity (BMI 30-39.9)       Relevant Orders   Comprehensive Metabolic Panel (CMET)   TSH     Diabetes mellitus screening       Relevant Orders   Hemoglobin A1c     Encounter for lipid screening for cardiovascular disease       Relevant Orders   Lipid panel     Screening for iron deficiency anemia       Relevant Orders   CBC with Differential     Assessment and Plan Assessment & Plan Elevated blood pressure Intermittent elevated blood pressure noted, possibly due to white coat hypertension. No home monitoring reported. - Order blood work for diabetes, cholesterol, kidney, and liver function. - Advise purchasing an arm blood pressure monitor. - Instruct to monitor blood pressure at home, return if consistently >130/80 mmHg. - Recommend Mediterranean diet, reduce salt, avoid processed foods. - Encourage 150  minutes of exercise weekly. - Schedule follow-up in 4-8 weeks after blood work.  Right hip and low back musculoskeletal pain Intermittent mild pain likely due to occupational strain. Musculoskeletal strain considered, other causes less likely. -  Outside abdominal CT : Negative abdominal CT 2022, 2025 - Advise frequent breaks, proper techniques for truck entry/exit. - Recommend Tylenol  for pain as needed. - Consider hip x-ray if pain increases or persists. - Instruct to seek immediate care if severe pain or additional symptoms occur.  Hepatic steatosis / Obesity  Obesity noted, lifestyle modifications emphasized for weight loss and health improvement. - Encourage Mediterranean diet, focus on vegetables, white meat, reduce processed/greasy foods. - Advise 150 minutes of exercise weekly. - Suggest MyFitnessPal app for dietary monitoring.    Return in about 4 weeks (around 02/18/2024) for Annual.   Vinary K Kannen Moxey, MD

## 2024-01-22 ENCOUNTER — Ambulatory Visit: Payer: Self-pay | Admitting: Family Medicine

## 2024-01-22 LAB — LIPID PANEL
Chol/HDL Ratio: 2.5 ratio (ref 0.0–5.0)
Cholesterol, Total: 116 mg/dL (ref 100–199)
HDL: 46 mg/dL (ref >39)
LDL Chol Calc (NIH): 52 mg/dL (ref 0–99)
Triglycerides: 95 mg/dL (ref 0–149)
VLDL Cholesterol Cal: 18 mg/dL (ref 5–40)

## 2024-01-22 LAB — COMPREHENSIVE METABOLIC PANEL WITH GFR
ALT: 38 IU/L (ref 0–44)
AST: 26 IU/L (ref 0–40)
Albumin: 4.6 g/dL (ref 4.3–5.2)
Alkaline Phosphatase: 72 IU/L (ref 44–121)
BUN/Creatinine Ratio: 7 — ABNORMAL LOW (ref 9–20)
BUN: 9 mg/dL (ref 6–20)
Bilirubin Total: 0.6 mg/dL (ref 0.0–1.2)
CO2: 20 mmol/L (ref 20–29)
Calcium: 9.5 mg/dL (ref 8.7–10.2)
Chloride: 104 mmol/L (ref 96–106)
Creatinine, Ser: 1.24 mg/dL (ref 0.76–1.27)
Globulin, Total: 2.5 g/dL (ref 1.5–4.5)
Glucose: 93 mg/dL (ref 70–99)
Potassium: 4.3 mmol/L (ref 3.5–5.2)
Sodium: 139 mmol/L (ref 134–144)
Total Protein: 7.1 g/dL (ref 6.0–8.5)
eGFR: 80 mL/min/1.73 (ref >59)

## 2024-01-22 LAB — CBC WITH DIFFERENTIAL/PLATELET
Basophils Absolute: 0 x10E3/uL (ref 0.0–0.2)
Basos: 1 %
EOS (ABSOLUTE): 0.2 x10E3/uL (ref 0.0–0.4)
Eos: 4 %
Hematocrit: 45.5 % (ref 37.5–51.0)
Hemoglobin: 15 g/dL (ref 13.0–17.7)
Immature Grans (Abs): 0 x10E3/uL (ref 0.0–0.1)
Immature Granulocytes: 0 %
Lymphocytes Absolute: 1.5 x10E3/uL (ref 0.7–3.1)
Lymphs: 34 %
MCH: 28.2 pg (ref 26.6–33.0)
MCHC: 33 g/dL (ref 31.5–35.7)
MCV: 86 fL (ref 79–97)
Monocytes Absolute: 0.4 x10E3/uL (ref 0.1–0.9)
Monocytes: 10 %
Neutrophils Absolute: 2.3 x10E3/uL (ref 1.4–7.0)
Neutrophils: 51 %
Platelets: 255 x10E3/uL (ref 150–450)
RBC: 5.31 x10E6/uL (ref 4.14–5.80)
RDW: 12.8 % (ref 11.6–15.4)
WBC: 4.4 x10E3/uL (ref 3.4–10.8)

## 2024-01-22 LAB — HEMOGLOBIN A1C
Est. average glucose Bld gHb Est-mCnc: 117 mg/dL
Hgb A1c MFr Bld: 5.7 % — ABNORMAL HIGH (ref 4.8–5.6)

## 2024-01-22 LAB — TSH: TSH: 2.59 u[IU]/mL (ref 0.450–4.500)

## 2024-01-26 ENCOUNTER — Encounter: Payer: Self-pay | Admitting: Urology

## 2024-02-26 ENCOUNTER — Encounter: Admitting: Family Medicine

## 2024-02-29 ENCOUNTER — Encounter: Admitting: Family Medicine

## 2024-03-03 ENCOUNTER — Encounter: Payer: Self-pay | Admitting: Family Medicine

## 2024-03-03 ENCOUNTER — Encounter: Admitting: Family Medicine
